# Patient Record
Sex: Female | Born: 1950 | Race: Black or African American | Hispanic: No | Marital: Married | Smoking: Never smoker
Health system: Southern US, Community
[De-identification: ages and names within clinical notes are randomized; demographics above are authoritative.]

## PROBLEM LIST (undated history)

## (undated) DIAGNOSIS — I82409 Acute embolism and thrombosis of unspecified deep veins of unspecified lower extremity: Secondary | ICD-10-CM

## (undated) DIAGNOSIS — N289 Disorder of kidney and ureter, unspecified: Secondary | ICD-10-CM

## (undated) DIAGNOSIS — E119 Type 2 diabetes mellitus without complications: Secondary | ICD-10-CM

## (undated) DIAGNOSIS — E7800 Pure hypercholesterolemia, unspecified: Secondary | ICD-10-CM

## (undated) DIAGNOSIS — I10 Essential (primary) hypertension: Secondary | ICD-10-CM

## (undated) HISTORY — PX: ABDOMINAL HYSTERECTOMY: SHX81

## (undated) HISTORY — PX: ROTATOR CUFF REPAIR: SHX139

## (undated) HISTORY — PX: KNEE SURGERY: SHX244

## (undated) HISTORY — PX: CHOLECYSTECTOMY: SHX55

## (undated) HISTORY — PX: ECTOPIC PREGNANCY SURGERY: SHX613

---

## 2013-09-11 ENCOUNTER — Ambulatory Visit: Payer: Self-pay | Admitting: Podiatry

## 2013-09-11 ENCOUNTER — Ambulatory Visit (INDEPENDENT_AMBULATORY_CARE_PROVIDER_SITE_OTHER): Payer: BC Managed Care – PPO | Admitting: Podiatry

## 2013-09-11 ENCOUNTER — Encounter: Payer: Self-pay | Admitting: Podiatry

## 2013-09-11 VITALS — BP 127/88 | HR 69

## 2013-09-11 DIAGNOSIS — M2010 Hallux valgus (acquired), unspecified foot: Secondary | ICD-10-CM

## 2013-09-11 DIAGNOSIS — B351 Tinea unguium: Secondary | ICD-10-CM | POA: Insufficient documentation

## 2013-09-11 DIAGNOSIS — M21619 Bunion of unspecified foot: Secondary | ICD-10-CM

## 2013-09-11 DIAGNOSIS — Q828 Other specified congenital malformations of skin: Secondary | ICD-10-CM | POA: Insufficient documentation

## 2013-09-11 MED ORDER — EFINACONAZOLE 10 % EX SOLN: 1.0000 "application " | Freq: Every morning | CUTANEOUS | Status: AC

## 2013-09-11 NOTE — Patient Instructions (Signed)
Seen for callus and fungal nails. All debrided. Jublia prescribed. Return as needed.

## 2013-09-11 NOTE — Progress Notes (Signed)
Subjective:  Moved from [place] 3 years ago. She has not seen a podiatrist since she left [place]  Patient presents with painful calluses and fungal nails. She was recently diagnosed with pre diabetic.   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  Thick dystrophic nail right hallux. All nails are thick and hypertrophic.  Neurovascular status are within normal. Orthopedic: Hallux valgus with bunion bilateral.  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 Plantar callus under 5th MPJ left and plantar medial heels.  Mycotic nails mild bilateral.  Plan: Reviewed findings. All callused lesions and nails debrided. Jublia prescribed.

## 2015-02-06 ENCOUNTER — Encounter: Payer: Self-pay | Admitting: Gastroenterology

## 2015-04-14 ENCOUNTER — Ambulatory Visit: Payer: Self-pay | Admitting: Gastroenterology

## 2016-06-10 ENCOUNTER — Encounter (HOSPITAL_BASED_OUTPATIENT_CLINIC_OR_DEPARTMENT_OTHER): Payer: Self-pay | Admitting: *Deleted

## 2016-06-10 ENCOUNTER — Emergency Department (HOSPITAL_BASED_OUTPATIENT_CLINIC_OR_DEPARTMENT_OTHER)
Admission: EM | Admit: 2016-06-10 | Discharge: 2016-06-10 | Disposition: A | Discharge status: Home / Self Care | Payer: Worker's Compensation | Attending: Emergency Medicine | Admitting: Emergency Medicine

## 2016-06-10 ENCOUNTER — Emergency Department (HOSPITAL_BASED_OUTPATIENT_CLINIC_OR_DEPARTMENT_OTHER): Payer: Worker's Compensation

## 2016-06-10 DIAGNOSIS — I10 Essential (primary) hypertension: Secondary | ICD-10-CM | POA: Diagnosis not present

## 2016-06-10 DIAGNOSIS — E119 Type 2 diabetes mellitus without complications: Secondary | ICD-10-CM | POA: Insufficient documentation

## 2016-06-10 DIAGNOSIS — M7989 Other specified soft tissue disorders: Secondary | ICD-10-CM | POA: Diagnosis not present

## 2016-06-10 DIAGNOSIS — M25571 Pain in right ankle and joints of right foot: Secondary | ICD-10-CM

## 2016-06-10 DIAGNOSIS — Z79899 Other long term (current) drug therapy: Secondary | ICD-10-CM | POA: Diagnosis not present

## 2016-06-10 HISTORY — DX: Acute embolism and thrombosis of unspecified deep veins of unspecified lower extremity: I82.409

## 2016-06-10 HISTORY — DX: Essential (primary) hypertension: I10

## 2016-06-10 HISTORY — DX: Type 2 diabetes mellitus without complications: E11.9
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
## 2016-06-10 NOTE — Discharge Instructions (Signed)
Your ultrasound today showed no evidence of a blood clot. Please follow up with orthopedics. Just in case, I have given you Dr. Lazaro ArmsHudnall's contact information. However, as we discussed, I think occupational health has referred you to someone already as part of your workman's comp. Return to the ER for new or worsening symptoms.
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
## 2017-04-06 ENCOUNTER — Encounter (HOSPITAL_BASED_OUTPATIENT_CLINIC_OR_DEPARTMENT_OTHER): Payer: Self-pay | Admitting: *Deleted

## 2017-04-06 ENCOUNTER — Emergency Department (HOSPITAL_BASED_OUTPATIENT_CLINIC_OR_DEPARTMENT_OTHER)
Admission: EM | Admit: 2017-04-06 | Discharge: 2017-04-06 | Disposition: A | Discharge status: Home / Self Care | Payer: BC Managed Care – PPO | Attending: Emergency Medicine | Admitting: Emergency Medicine

## 2017-04-06 DIAGNOSIS — R1032 Left lower quadrant pain: Secondary | ICD-10-CM | POA: Insufficient documentation

## 2017-04-06 DIAGNOSIS — I10 Essential (primary) hypertension: Secondary | ICD-10-CM | POA: Diagnosis not present

## 2017-04-06 DIAGNOSIS — M25552 Pain in left hip: Secondary | ICD-10-CM | POA: Diagnosis present

## 2017-04-06 DIAGNOSIS — E119 Type 2 diabetes mellitus without complications: Secondary | ICD-10-CM | POA: Diagnosis not present

## 2017-04-06 DIAGNOSIS — Z79899 Other long term (current) drug therapy: Secondary | ICD-10-CM | POA: Diagnosis not present

## 2017-04-06 MED ORDER — MELOXICAM 15 MG PO TABS: 15.0000 mg | ORAL_TABLET | Freq: Every day | ORAL | 0 refills | Status: DC | PRN

## 2017-04-06 MED ORDER — DEXAMETHASONE 4 MG PO TABS
8.0000 mg | ORAL_TABLET | Freq: Once | ORAL | Status: AC
  Administered 2017-04-06: 8 mg via ORAL
  Filled 2017-04-06: qty 2

## 2017-04-06 MED ORDER — KETOROLAC TROMETHAMINE 15 MG/ML IJ SOLN
30.0000 mg | Freq: Once | INTRAMUSCULAR | Status: AC
  Administered 2017-04-06: 30 mg via INTRAMUSCULAR
  Filled 2017-04-06: qty 2

## 2017-04-06 MED ORDER — TRAMADOL HCL 50 MG PO TABS: 50.0000 mg | ORAL_TABLET | Freq: Four times a day (QID) | ORAL | 0 refills | Status: DC | PRN

## 2017-04-06 MED ORDER — HYDROMORPHONE HCL 1 MG/ML IJ SOLN
1.0000 mg | Freq: Once | INTRAMUSCULAR | Status: AC
  Administered 2017-04-06: 1 mg via INTRAMUSCULAR
  Filled 2017-04-06: qty 1

## 2017-04-06 MED ORDER — LORAZEPAM 1 MG PO TABS
1.0000 mg | ORAL_TABLET | Freq: Once | ORAL | Status: AC
Start: 2017-04-06 — End: 2017-04-06
  Administered 2017-04-06: 1 mg via ORAL
  Filled 2017-04-06: qty 1

## 2017-04-06 MED ORDER — MELOXICAM 15 MG PO TABS: 15.0000 mg | ORAL_TABLET | Freq: Every day | ORAL | 0 refills | Status: AC | PRN

## 2017-04-06 NOTE — ED Triage Notes (Signed)
Pt c/o lower back pain which radiates down to left hip and buttocks
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
## 2017-04-06 NOTE — ED Notes (Signed)
Pt teaching provided on medications that may cause drowsiness. Pt instructed not to drive or operate heavy machinery while taking the prescribed medication. Pt verbalized understanding.   

## 2018-03-03 ENCOUNTER — Encounter (HOSPITAL_BASED_OUTPATIENT_CLINIC_OR_DEPARTMENT_OTHER): Payer: Self-pay | Admitting: Emergency Medicine

## 2018-03-03 ENCOUNTER — Emergency Department (HOSPITAL_BASED_OUTPATIENT_CLINIC_OR_DEPARTMENT_OTHER)
Admission: EM | Admit: 2018-03-03 | Discharge: 2018-03-03 | Disposition: A | Discharge status: Home / Self Care | Payer: BC Managed Care – PPO | Attending: Emergency Medicine | Admitting: Emergency Medicine

## 2018-03-03 ENCOUNTER — Other Ambulatory Visit: Payer: Self-pay

## 2018-03-03 ENCOUNTER — Emergency Department (HOSPITAL_BASED_OUTPATIENT_CLINIC_OR_DEPARTMENT_OTHER): Payer: BC Managed Care – PPO

## 2018-03-03 DIAGNOSIS — I10 Essential (primary) hypertension: Secondary | ICD-10-CM | POA: Diagnosis not present

## 2018-03-03 DIAGNOSIS — Z7984 Long term (current) use of oral hypoglycemic drugs: Secondary | ICD-10-CM | POA: Insufficient documentation

## 2018-03-03 DIAGNOSIS — R51 Headache: Secondary | ICD-10-CM | POA: Insufficient documentation

## 2018-03-03 DIAGNOSIS — E119 Type 2 diabetes mellitus without complications: Secondary | ICD-10-CM | POA: Insufficient documentation

## 2018-03-03 DIAGNOSIS — M542 Cervicalgia: Secondary | ICD-10-CM | POA: Diagnosis present

## 2018-03-03 DIAGNOSIS — Z79899 Other long term (current) drug therapy: Secondary | ICD-10-CM | POA: Diagnosis not present

## 2018-03-03 DIAGNOSIS — R202 Paresthesia of skin: Secondary | ICD-10-CM | POA: Insufficient documentation

## 2018-03-03 HISTORY — DX: Pure hypercholesterolemia, unspecified: E78.00

## 2018-03-03 HISTORY — DX: Disorder of kidney and ureter, unspecified: N28.9

## 2018-03-03 LAB — CBC WITH DIFFERENTIAL/PLATELET
Basophils Absolute: 0 10*3/uL (ref 0.0–0.1)
Basophils Relative: 0 %
EOS ABS: 0.1 10*3/uL (ref 0.0–0.7)
Eosinophils Relative: 1 %
HCT: 37.1 % (ref 36.0–46.0)
HEMOGLOBIN: 12.5 g/dL (ref 12.0–15.0)
LYMPHS ABS: 1.5 10*3/uL (ref 0.7–4.0)
Lymphocytes Relative: 24 %
MCH: 28.7 pg (ref 26.0–34.0)
MCHC: 33.7 g/dL (ref 30.0–36.0)
MCV: 85.1 fL (ref 78.0–100.0)
MONOS PCT: 12 %
Monocytes Absolute: 0.8 10*3/uL (ref 0.1–1.0)
NEUTROS PCT: 63 %
Neutro Abs: 4 10*3/uL (ref 1.7–7.7)
PLATELETS: 250 10*3/uL (ref 150–400)
RBC: 4.36 MIL/uL (ref 3.87–5.11)
RDW: 12.3 % (ref 11.5–15.5)
WBC: 6.3 10*3/uL (ref 4.0–10.5)

## 2018-03-03 LAB — BASIC METABOLIC PANEL
Anion gap: 7 (ref 5–15)
BUN: 14 mg/dL (ref 8–23)
CALCIUM: 9.3 mg/dL (ref 8.9–10.3)
CO2: 28 mmol/L (ref 22–32)
CREATININE: 1.34 mg/dL — AB (ref 0.44–1.00)
Chloride: 104 mmol/L (ref 98–111)
GFR calc non Af Amer: 40 mL/min — ABNORMAL LOW (ref >60)
GFR, EST AFRICAN AMERICAN: 46 mL/min — AB (ref >60)
Glucose, Bld: 113 mg/dL — ABNORMAL HIGH (ref 70–99)
Potassium: 3.8 mmol/L (ref 3.5–5.1)
SODIUM: 139 mmol/L (ref 135–145)

## 2018-03-03 MED ORDER — IOPAMIDOL (ISOVUE-370) INJECTION 76%
100.0000 mL | Freq: Once | INTRAVENOUS | Status: AC | PRN
  Administered 2018-03-03: 100 mL via INTRAVENOUS

## 2018-03-03 MED ORDER — MORPHINE SULFATE (PF) 4 MG/ML IV SOLN
4.0000 mg | Freq: Once | INTRAVENOUS | Status: AC
  Administered 2018-03-03: 4 mg via INTRAVENOUS
  Filled 2018-03-03: qty 1

## 2018-03-03 MED ORDER — GABAPENTIN 100 MG PO CAPS
100.0000 mg | ORAL_CAPSULE | Freq: Once | ORAL | Status: AC
  Administered 2018-03-03: 100 mg via ORAL
  Filled 2018-03-03: qty 1

## 2018-03-03 MED ORDER — PREDNISONE 20 MG PO TABS: 20.0000 mg | ORAL_TABLET | Freq: Every day | ORAL | 0 refills | Status: AC

## 2018-03-03 MED ORDER — FENTANYL CITRATE (PF) 100 MCG/2ML IJ SOLN
50.0000 ug | Freq: Once | INTRAMUSCULAR | Status: AC
  Administered 2018-03-03: 50 ug via INTRAVENOUS
  Filled 2018-03-03: qty 2

## 2018-03-03 MED ORDER — DIAZEPAM 5 MG/ML IJ SOLN
5.0000 mg | Freq: Once | INTRAMUSCULAR | Status: AC
  Administered 2018-03-03: 5 mg via INTRAVENOUS
  Filled 2018-03-03: qty 2

## 2018-03-03 MED ORDER — ORPHENADRINE CITRATE ER 100 MG PO TB12: 100.0000 mg | ORAL_TABLET | Freq: Two times a day (BID) | ORAL | 0 refills | Status: AC

## 2018-03-03 NOTE — Discharge Instructions (Signed)
You can take 1000 mg of Tylenol.  Do not exceed 4000 mg of Tylenol a day.  Take the prednisone as directed.  This may cause her blood sugars to rise over the next few days.  He can take the Norflex as directed.  This is a muscle relaxer.  It may work better than the one that you are previously prescribed.  Do not take this muscle relaxer and the other one at the same time.  Follow-up with your primary care doctor next 2 to 4 days for further evaluation.  Return to emergency department for any worsening pain, numbness/weakness of her arms or legs, fevers, vision changes or any other worsening or concerning symptoms.  As we discussed, there was a small nodule seen on your thyroid and today's examination.  This will need to be followed up by your primary care doctor.

## 2018-03-03 NOTE — ED Notes (Signed)
Provided water for pt for po challenge.

## 2018-03-03 NOTE — ED Triage Notes (Signed)
Neck pain radiating into her arms since Wednesday. Also reports numbness to both arms. Was called in a muscle relaxer from her PCP.

## 2018-03-03 NOTE — ED Provider Notes (Signed)
[facility] EMERGENCY DEPARTMENT Provider Note   CSN: [id number] Arrival date & time: 03/03/18  1154     History   Chief Complaint Chief Complaint  Patient presents with  . Neck pain    HPI Felicia Rasmussen is a 67 y.o. female with PMH/o DM, DVT, HTN who presents for evaluation of left sided neck pain x 3 days. She reports that 3 days ago she started having a "shooting" pain in the left side of her head. She states it felt like a headache and she took some OTC medications to help. Pain was gradual in nature with no thunderclap headache. She reports that shortly after she started having some left sided neck pain that she describes as "throbbing." She reports that that pain is worse with movement of her neck. She denies any preceding trauma, injury or fall. She called her PCP yesterday who prescribed her muscles relaxers. Patient states that they did not provide her much relief. Patient reports that she has slept in her chair the last two nights because of the pain. Because of this, she states she has laid on her arms. She reports that she would wake up with tingling sensation  In her BUE, most notably in her RUE. She reports that it would eventually resolve. She has been able to ambulate without any difficult. Patient denies any fevers, vision changes, chest pain, SOB, abdmoinal pain, nausea/vomiting, weight loss, saddle anesthesia, urinary or bowel incontinence.   The history is provided by the patient.    Past Medical History:  Diagnosis Date  . Diabetes mellitus without complication (HCC)   . DVT (deep venous thrombosis) (HCC)   . High cholesterol   . Hypertension   . Renal disorder     Patient Active Problem List   Diagnosis Date Noted  . Porokeratosis 09/11/2013  . Onychomycosis 09/11/2013  . Bunion 09/11/2013    Past Surgical History:  Procedure Laterality Date  . ABDOMINAL HYSTERECTOMY    . CHOLECYSTECTOMY    . ECTOPIC PREGNANCY SURGERY    . KNEE SURGERY        OB History   None      Home Medications    Prior to Admission medications   Medication Sig Start Date End Date Taking? Authorizing Provider  metoprolol tartrate (LOPRESSOR) 25 MG tablet Take 25 mg by mouth 2 (two) times daily.   Yes [provider]  sitaGLIPtin (JANUVIA) 100 MG tablet Take 100 mg by mouth daily.   Yes [provider]  b complex vitamins tablet Take 1 tablet by mouth daily.    [provider]  Biotin 1 MG CAPS Take by mouth daily.    [provider]  Efinaconazole (JUBLIA) 10 % SOLN Apply 1 application topically every morning. 09/11/13   Sheard, Myeong O, DPM  hydrochlorothiazide (HYDRODIURIL) 25 MG tablet Take 25 mg by mouth daily.    [provider]  lovastatin (MEVACOR) 10 MG tablet Take 10 mg by mouth at bedtime.    [provider]  meloxicam (MOBIC) 15 MG tablet Take 1 tablet (15 mg total) by mouth daily as needed for pain. 04/06/17   Raeford Razor, MD  Multiple Vitamin (MULTIVITAMIN) tablet Take 1 tablet by mouth daily.    [provider]  orphenadrine (NORFLEX) 100 MG tablet Take 1 tablet (100 mg total) by mouth 2 (two) times daily for 5 days. 03/03/18 03/08/18  Maxwell Caul, PA-C  predniSONE (DELTASONE) 20 MG tablet Take 1 tablet (20 mg total)  by mouth daily for 4 days. 03/03/18 03/07/18  Maxwell Caul, PA-C  traMADol (ULTRAM) 50 MG tablet Take 1 tablet (50 mg total) by mouth every 6 (six) hours as needed. 04/06/17   Raeford Razor, MD    Family History No family history on file.  Social History Social History   Tobacco Use  . Smoking status: Never Smoker  . Smokeless tobacco: Never Used  Substance Use Topics  . Alcohol use: No  . Drug use: No     Allergies   Nsaids and Vicodin [hydrocodone-acetaminophen]   Review of Systems Review of Systems  Constitutional: Negative for fever.  Respiratory: Negative for cough and shortness of breath.   Cardiovascular: Negative for chest pain.   Gastrointestinal: Negative for abdominal pain, nausea and vomiting.  Genitourinary: Negative for dysuria and hematuria.  Musculoskeletal: Positive for neck pain. Negative for back pain.  Neurological: Positive for numbness (tingling). Negative for weakness and headaches.  All other systems reviewed and are negative.    Physical Exam Updated Vital Signs BP (!) 147/94   Pulse (!) 59   Temp 98.5 F (36.9 C)   Resp 17   Ht 5\' 6"  (1.676 m)   Wt 90.3 kg (199 lb)   SpO2 94%   BMI 32.12 kg/m   Physical Exam  Constitutional: She is oriented to person, place, and time. She appears well-developed and well-nourished.  HENT:  Head: Normocephalic and atraumatic.  Mouth/Throat: Oropharynx is clear and moist and mucous membranes are normal.  Eyes: Pupils are equal, round, and reactive to light. Conjunctivae, EOM and lids are normal.  Neck: Neck supple. Carotid bruit is not present.    Some limited ROM secondary to pain. Neck is supple without rigidity. Tenderness to palpation noted to the left side of the neck that begins at the paraspinal muscles and extends anteriorly.   Cardiovascular: Normal rate, regular rhythm, normal heart sounds and normal pulses. Exam reveals no gallop and no friction rub.  No murmur heard. Pulmonary/Chest: Effort normal and breath sounds normal.  Abdominal: Soft. Normal appearance. There is no tenderness. There is no rigidity and no guarding.  Musculoskeletal: Normal range of motion.  Neurological: She is alert and oriented to person, place, and time.  Cranial nerves III-XII intact Follows commands, Moves all extremities  5/5 strength to BUE and BLE  Sensation intact throughout all major nerve distributions Normal finger to nose. No dysdiadochokinesia. No pronator drift. No gait abnormalities  No slurred speech. No facial droop.   Skin: Skin is warm and dry. Capillary refill takes less than 2 seconds.  Psychiatric: She has a normal mood and affect. Her  speech is normal.  Nursing note and vitals reviewed.    ED Treatments / Results  Labs (all labs ordered are listed, but only abnormal results are displayed) Labs Reviewed  BASIC METABOLIC PANEL - Abnormal; Notable for the following components:      Result Value   Glucose, Bld 113 (*)    Creatinine, Ser 1.34 (*)    GFR calc non Af Amer 40 (*)    GFR calc Af Amer 46 (*)    All other components within normal limits  CBC WITH DIFFERENTIAL/PLATELET    EKG None  Radiology Ct Angio Head W Or Wo Contrast  Result Date: 03/03/2018 CLINICAL DATA:  67 y/o  F; headache and neck pain for 3 days. EXAM: CT ANGIOGRAPHY HEAD AND NECK TECHNIQUE: Multidetector CT imaging of the head and neck was performed using the standard protocol  during bolus administration of intravenous contrast. Multiplanar CT image reconstructions and MIPs were obtained to evaluate the vascular anatomy. Carotid stenosis measurements (when applicable) are obtained utilizing NASCET criteria, using the distal internal carotid diameter as the denominator. CONTRAST:  ISOVUE-370 IOPAMIDOL (ISOVUE-370) INJECTION 76% COMPARISON:  None. FINDINGS: CT HEAD FINDINGS Brain: No evidence of acute infarction, hemorrhage, hydrocephalus, extra-axial collection or mass lesion/mass effect. Mild chronic microvascular ischemic changes and parenchymal volume loss of the brain Vascular: As below. Skull: Normal. Negative for fracture or focal lesion. Sinuses: Left maxillary sinus mucous retention cyst or polyp. Otherwise negative. Orbits: No acute finding. Review of the MIP images confirms the above findings CTA NECK FINDINGS Aortic arch: Bovine variant branching and left vertebral artery arises from the arch. Imaged portion shows no evidence of aneurysm or dissection. No significant stenosis of the major arch vessel origins. Right carotid system: No evidence of dissection, stenosis (50% or greater) or occlusion. Left carotid system: No evidence of  dissection, stenosis (50% or greater) or occlusion. Vertebral arteries: Codominant. No evidence of dissection, stenosis (50% or greater) or occlusion. Skeleton: Advanced cervical spondylosis with multilevel disc space narrowing and marginal osteophytes. No high-grade bony canal stenosis. Other neck: Multiple thyroid nodules measuring up to 17 mm in the right lobe of the thyroid (series 5, image 47). Upper chest: Several small pulmonary nodules in the lung apices and small focus of consolidation in the superior segment of the left lower lobe. Review of the MIP images confirms the above findings CTA HEAD FINDINGS Anterior circulation: No significant stenosis, proximal occlusion, aneurysm, or vascular malformation. Mild non stenotic calcific atherosclerosis of the carotid siphons. Posterior circulation: No significant stenosis, proximal occlusion, aneurysm, or vascular malformation. Minimal calcific atherosclerosis of vertebral arteries. Venous sinuses: As permitted by contrast timing, patent. Anatomic variants: Complete circle-of-Willis. Large left A1, small right A1, large anterior communicating artery, normal variant. Delayed phase: No abnormal intracranial enhancement. Review of the MIP images confirms the above findings IMPRESSION: 1. No large vessel occlusion, aneurysm, dissection, or hemodynamically significant stenosis by NASCET criteria. 2. Mild non stenotic calcific atherosclerosis of the aorta and carotid systems. 3. Thyroid nodules measuring up to 17 mm. Further evaluation with thyroid ultrasound is recommended on a nonemergent basis. 4. Cervical spondylosis with advanced multilevel discogenic degenerative changes. Electronically Signed   By: Mitzi Hansen M.D.   On: 03/03/2018 17:28   Ct Angio Neck W And/or Wo Contrast  Result Date: 03/03/2018 CLINICAL DATA:  67 y/o  F; headache and neck pain for 3 days. EXAM: CT ANGIOGRAPHY HEAD AND NECK TECHNIQUE: Multidetector CT imaging of the head and  neck was performed using the standard protocol during bolus administration of intravenous contrast. Multiplanar CT image reconstructions and MIPs were obtained to evaluate the vascular anatomy. Carotid stenosis measurements (when applicable) are obtained utilizing NASCET criteria, using the distal internal carotid diameter as the denominator. CONTRAST:  ISOVUE-370 IOPAMIDOL (ISOVUE-370) INJECTION 76% COMPARISON:  None. FINDINGS: CT HEAD FINDINGS Brain: No evidence of acute infarction, hemorrhage, hydrocephalus, extra-axial collection or mass lesion/mass effect. Mild chronic microvascular ischemic changes and parenchymal volume loss of the brain Vascular: As below. Skull: Normal. Negative for fracture or focal lesion. Sinuses: Left maxillary sinus mucous retention cyst or polyp. Otherwise negative. Orbits: No acute finding. Review of the MIP images confirms the above findings CTA NECK FINDINGS Aortic arch: Bovine variant branching and left vertebral artery arises from the arch. Imaged portion shows no evidence of aneurysm or dissection. No significant stenosis of the  major arch vessel origins. Right carotid system: No evidence of dissection, stenosis (50% or greater) or occlusion. Left carotid system: No evidence of dissection, stenosis (50% or greater) or occlusion. Vertebral arteries: Codominant. No evidence of dissection, stenosis (50% or greater) or occlusion. Skeleton: Advanced cervical spondylosis with multilevel disc space narrowing and marginal osteophytes. No high-grade bony canal stenosis. Other neck: Multiple thyroid nodules measuring up to 17 mm in the right lobe of the thyroid (series 5, image 47). Upper chest: Several small pulmonary nodules in the lung apices and small focus of consolidation in the superior segment of the left lower lobe. Review of the MIP images confirms the above findings CTA HEAD FINDINGS Anterior circulation: No significant stenosis, proximal occlusion, aneurysm, or vascular  malformation. Mild non stenotic calcific atherosclerosis of the carotid siphons. Posterior circulation: No significant stenosis, proximal occlusion, aneurysm, or vascular malformation. Minimal calcific atherosclerosis of vertebral arteries. Venous sinuses: As permitted by contrast timing, patent. Anatomic variants: Complete circle-of-Willis. Large left A1, small right A1, large anterior communicating artery, normal variant. Delayed phase: No abnormal intracranial enhancement. Review of the MIP images confirms the above findings IMPRESSION: 1. No large vessel occlusion, aneurysm, dissection, or hemodynamically significant stenosis by NASCET criteria. 2. Mild non stenotic calcific atherosclerosis of the aorta and carotid systems. 3. Thyroid nodules measuring up to 17 mm. Further evaluation with thyroid ultrasound is recommended on a nonemergent basis. 4. Cervical spondylosis with advanced multilevel discogenic degenerative changes. Electronically Signed   By: Mitzi Hansen M.D.   On: 03/03/2018 17:28    Procedures Procedures (including critical care time)  Medications Ordered in ED Medications  diazepam (VALIUM) injection 5 mg (5 mg Intravenous Given 03/03/18 1339)  iopamidol (ISOVUE-370) 76 % injection 100 mL (100 mLs Intravenous Contrast Given 03/03/18 1619)  fentaNYL (SUBLIMAZE) injection 50 mcg (50 mcg Intravenous Given 03/03/18 1727)  gabapentin (NEURONTIN) capsule 100 mg (100 mg Oral Given 03/03/18 1828)  morphine 4 MG/ML injection 4 mg (4 mg Intravenous Given 03/03/18 1900)     Initial Impression / Assessment and Plan / ED Course  I have reviewed the triage vital signs and the nursing notes.  Pertinent labs & imaging results that were available during my care of the patient were reviewed by me and considered in my medical decision making (see chart for details).     67 y.o. F with PMH/o HTN, DM who presents for evalution of neck pain x 3 days.  She denies any preceding trauma,  injury, fall.  No fevers.  She reports is because of the pain, she has had to sleep in her chair.  Patient reports that this is caused her to sleep on her arms on several occasions and states that when she has woken in the morning, she has had some tingling sensation to the bottom of her arms, right greater than left.  She reports after an hour so that the tingling sensation resolved.  She still able to move her arms some difficulty.  Denies any numbness/weakness at this time.  No dizziness, vision changes, facial drooping, difficulty speaking.  Patient is afebrile, non-toxic appearing, sitting comfortably on examination table. Vital signs reviewed and stable.  Patient is slightly hypertensive but will reassess after analgesics.  No neuro deficits on exam.  Patient with left-sided paraspinal tenderness noted on exam that extends just over to the midline extends midline.  Consider musculoskeletal strain versus torticollis.  While patient appears clinically stable and does not appear to be a dissection, given concerns of neck  pain and headache, limited range of motion, numbness/tingling complaints, also consider dissection.  Will plan to get basic labs, CTA of head and neck.  Analgesics provided.  History/physical exam is not concerning for meningitis.  BMP shows creatinine at 1.34.  Otherwise unremarkable.  CBC is without any significant leukocytosis, anemia.  CTA of head shows no evidence of large vessel occlusion, aneurysm, dissection.  There is an incidental finding of a thyroid nodule measuring up to 17 mm.  Skeletal structures and show underlying spondylosis with degenerative disc disease but otherwise no acute bony abnormalities.  Discussed results with patient.  She reports some very mild improvement in pain after analgesics.  Will give additional analgesics here in the department.  Updated patient on incidental finding of thyroid nodule and that she will need to have that evaluated.  Review of records  show the patient has had similar neck pain previously.  It looks like in 2018, she presented to her primary care doctor and wake with similar complaints.  At that time, was thought to be contributing to her degenerative disc disease.  Additionally, thyroid nodule was seen at that time.  Patient reports some improvement in pain after additional analgesics.  Vital signs are stable.  She is still slightly hypertensive but otherwise fine.  Patient is ambulating in the department.  We will send patient home with analgesics.  Encourage primary care follow-up. Patient had ample opportunity for questions and discussion. All patient's questions were answered with full understanding. Strict return precautions discussed. Patient expresses understanding and agreement to plan.   Final Clinical Impressions(s) / ED Diagnoses   Final diagnoses:  Neck pain    ED Discharge Orders        Ordered    predniSONE (DELTASONE) 20 MG tablet  Daily     03/03/18 1926    orphenadrine (NORFLEX) 100 MG tablet  2 times daily     03/03/18 1926       Maxwell CaulLayden, Ranisha Allaire A, PA-C 03/04/18 1503    Tegeler, Canary Brimhristopher J, MD 03/05/18 [phone]

## 2018-03-03 NOTE — ED Notes (Signed)
Pt in CT.

## 2018-04-18 ENCOUNTER — Emergency Department (HOSPITAL_BASED_OUTPATIENT_CLINIC_OR_DEPARTMENT_OTHER): Payer: No Typology Code available for payment source

## 2018-04-18 ENCOUNTER — Emergency Department (HOSPITAL_BASED_OUTPATIENT_CLINIC_OR_DEPARTMENT_OTHER)
Admission: EM | Admit: 2018-04-18 | Discharge: 2018-04-18 | Disposition: A | Discharge status: Home / Self Care | Payer: No Typology Code available for payment source | Attending: Emergency Medicine | Admitting: Emergency Medicine

## 2018-04-18 ENCOUNTER — Encounter (HOSPITAL_BASED_OUTPATIENT_CLINIC_OR_DEPARTMENT_OTHER): Payer: Self-pay

## 2018-04-18 ENCOUNTER — Other Ambulatory Visit: Payer: Self-pay

## 2018-04-18 DIAGNOSIS — E119 Type 2 diabetes mellitus without complications: Secondary | ICD-10-CM | POA: Diagnosis not present

## 2018-04-18 DIAGNOSIS — S40911A Unspecified superficial injury of right shoulder, initial encounter: Secondary | ICD-10-CM | POA: Diagnosis present

## 2018-04-18 DIAGNOSIS — M25512 Pain in left shoulder: Secondary | ICD-10-CM | POA: Insufficient documentation

## 2018-04-18 DIAGNOSIS — Y9389 Activity, other specified: Secondary | ICD-10-CM | POA: Diagnosis not present

## 2018-04-18 DIAGNOSIS — Y990 Civilian activity done for income or pay: Secondary | ICD-10-CM | POA: Diagnosis not present

## 2018-04-18 DIAGNOSIS — M25511 Pain in right shoulder: Secondary | ICD-10-CM | POA: Insufficient documentation

## 2018-04-18 DIAGNOSIS — W19XXXA Unspecified fall, initial encounter: Secondary | ICD-10-CM

## 2018-04-18 DIAGNOSIS — Z79899 Other long term (current) drug therapy: Secondary | ICD-10-CM | POA: Diagnosis not present

## 2018-04-18 DIAGNOSIS — S40912A Unspecified superficial injury of left shoulder, initial encounter: Secondary | ICD-10-CM | POA: Insufficient documentation

## 2018-04-18 DIAGNOSIS — Z7984 Long term (current) use of oral hypoglycemic drugs: Secondary | ICD-10-CM | POA: Diagnosis not present

## 2018-04-18 DIAGNOSIS — Y9259 Other trade areas as the place of occurrence of the external cause: Secondary | ICD-10-CM | POA: Insufficient documentation

## 2018-04-18 DIAGNOSIS — M25561 Pain in right knee: Secondary | ICD-10-CM

## 2018-04-18 DIAGNOSIS — I10 Essential (primary) hypertension: Secondary | ICD-10-CM | POA: Insufficient documentation

## 2018-04-18 DIAGNOSIS — W01198A Fall on same level from slipping, tripping and stumbling with subsequent striking against other object, initial encounter: Secondary | ICD-10-CM | POA: Diagnosis not present

## 2018-04-18 MED ORDER — TRAMADOL HCL 50 MG PO TABS: 50.0000 mg | ORAL_TABLET | Freq: Four times a day (QID) | ORAL | 0 refills | Status: AC | PRN

## 2018-04-18 MED ORDER — TRAMADOL HCL 50 MG PO TABS
50.0000 mg | ORAL_TABLET | Freq: Once | ORAL | Status: AC
  Administered 2018-04-18: 50 mg via ORAL
  Filled 2018-04-18: qty 1

## 2018-04-18 NOTE — ED Triage Notes (Signed)
Pt tripped over a carpet at work approx 3pm-pain to right knee, right shoulder, neck, left hand and left shoulder-to triage in w/c

## 2018-04-18 NOTE — ED Notes (Signed)
Pt/family verbalized understanding of discharge instructions.   

## 2018-04-18 NOTE — ED Provider Notes (Signed)
[facility] EMERGENCY DEPARTMENT Provider Note   CSN: [id number] Arrival date & time: 04/18/18  1557     History   Chief Complaint Chief Complaint  Patient presents with  . Fall    HPI Felicia Rasmussen is a 67 y.o. female.  She states she was leaving work and tripped over a carpet at around 3 PM today.  She fell onto her knees.  She denies LOC.  She is complaining of moderate pain to her right knee and bilateral shoulders.  She also had 1 of her fingernails on her left hand partially avulsed that she is to taper back down.  She denies hitting her head or back.  She is not on blood thinners.  No chest pain no shortness of breath no abdominal pain.  The history is provided by the patient.  Fall  This is a new problem. The current episode started 1 to 2 hours ago. The problem occurs constantly. The problem has not changed since onset.Pertinent negatives include no chest pain, no abdominal pain, no headaches and no shortness of breath. The symptoms are aggravated by walking, bending and twisting. Nothing relieves the symptoms. She has tried nothing for the symptoms. The treatment provided no relief.    Past Medical History:  Diagnosis Date  . Diabetes mellitus without complication (HCC)   . DVT (deep venous thrombosis) (HCC)   . High cholesterol   . Hypertension   . Renal disorder     Patient Active Problem List   Diagnosis Date Noted  . Porokeratosis 09/11/2013  . Onychomycosis 09/11/2013  . Bunion 09/11/2013    Past Surgical History:  Procedure Laterality Date  . ABDOMINAL HYSTERECTOMY    . CHOLECYSTECTOMY    . ECTOPIC PREGNANCY SURGERY    . KNEE SURGERY       OB History   None      Home Medications    Prior to Admission medications   Medication Sig Start Date End Date Taking? Authorizing Provider  b complex vitamins tablet Take 1 tablet by mouth daily.    [provider]  Biotin 1 MG CAPS Take by mouth daily.    [provider]    Efinaconazole (JUBLIA) 10 % SOLN Apply 1 application topically every morning. 09/11/13   Sheard, Myeong O, DPM  hydrochlorothiazide (HYDRODIURIL) 25 MG tablet Take 25 mg by mouth daily.    [provider]  lovastatin (MEVACOR) 10 MG tablet Take 10 mg by mouth at bedtime.    [provider]  meloxicam (MOBIC) 15 MG tablet Take 1 tablet (15 mg total) by mouth daily as needed for pain. 04/06/17   Raeford RazorKohut, Stephen, MD  metoprolol tartrate (LOPRESSOR) 25 MG tablet Take 25 mg by mouth 2 (two) times daily.    [provider]  Multiple Vitamin (MULTIVITAMIN) tablet Take 1 tablet by mouth daily.    [provider]  sitaGLIPtin (JANUVIA) 100 MG tablet Take 100 mg by mouth daily.    [provider]  traMADol (ULTRAM) 50 MG tablet Take 1 tablet (50 mg total) by mouth every 6 (six) hours as needed. 04/06/17   Raeford RazorKohut, Stephen, MD    Family History No family history on file.  Social History Social History   Tobacco Use  . Smoking status: Never Smoker  . Smokeless tobacco: Never Used  Substance Use Topics  . Alcohol use: No  . Drug use: No     Allergies   Nsaids and Vicodin [hydrocodone-acetaminophen]   Review of  Systems Review of Systems  Constitutional: Negative for fever.  HENT: Negative for sore throat.   Eyes: Negative for visual disturbance.  Respiratory: Negative for shortness of breath.   Cardiovascular: Negative for chest pain.  Gastrointestinal: Negative for abdominal pain.  Genitourinary: Negative for dysuria.  Musculoskeletal: Negative for back pain.  Skin: Negative for rash.  Neurological: Negative for headaches.     Physical Exam Updated Vital Signs BP (!) 169/77 (BP Location: Left Arm)   Pulse (!) 57   Temp 98.2 F (36.8 C) (Oral)   Resp 18   Ht 5\' 6"  (1.676 m)   Wt 85.7 kg   SpO2 99%   BMI 30.51 kg/m   Physical Exam  Constitutional: She appears well-developed and well-nourished.  HENT:  Head: Normocephalic and  atraumatic.  Eyes: Conjunctivae are normal.  Neck: Normal range of motion. Neck supple.  Cardiovascular: Normal rate, regular rhythm and normal heart sounds.  Pulmonary/Chest: Effort normal. She has no wheezes. She has no rales. She exhibits no tenderness.  Abdominal: Soft. She exhibits no mass. There is no tenderness. There is no guarding.  Musculoskeletal: Normal range of motion.  She is diffuse tenderness over her right and left anterior shoulders.  She is got normal internal and external rotation and normal landmarks.  She also has some tenderness over her left hand third digit at the PIP.  There is full range of motion though no overlying ecchymosis.  She also has tenderness over her right patella although no joint line tenderness and no effusion or ecchymosis.  Neurological: She is alert. GCS eye subscore is 4. GCS verbal subscore is 5. GCS motor subscore is 6.  Skin: Skin is warm and dry. Capillary refill takes less than 2 seconds.  Psychiatric: She has a normal mood and affect.  Nursing note and vitals reviewed.    ED Treatments / Results  Labs (all labs ordered are listed, but only abnormal results are displayed) Labs Reviewed - No data to display  EKG None  Radiology Dg Shoulder Right  Result Date: 04/18/2018 CLINICAL DATA:  Fall with shoulder pain EXAM: RIGHT SHOULDER - 2+ VIEW COMPARISON:  None. FINDINGS: Moderate AC joint degenerative change. Mild glenohumeral degenerative change. No fracture or malalignment. IMPRESSION: No acute osseous abnormality Electronically Signed   By: Jasmine PangKim  Fujinaga M.D.   On: 04/18/2018 17:28   Dg Shoulder Left  Result Date: 04/18/2018 CLINICAL DATA:  Fall with shoulder pain EXAM: LEFT SHOULDER - 2+ VIEW COMPARISON:  None. FINDINGS: Moderate AC joint degenerative change. Mild glenohumeral degenerative change. No acute fracture or dislocation. IMPRESSION: No acute osseous abnormality Electronically Signed   By: Jasmine PangKim  Fujinaga M.D.   On: 04/18/2018  17:27   Dg Knee Complete 4 Views Right  Result Date: 04/18/2018 CLINICAL DATA:  Fall with knee pain EXAM: RIGHT KNEE - COMPLETE 4+ VIEW COMPARISON:  None. FINDINGS: Prominent joint space calcification. Mild joint space narrowing medially laterally. Mild patellofemoral degenerative change. Small knee effusion. No fracture or malalignment. IMPRESSION: No acute osseous abnormality. Mild tricompartment arthritis. Chondrocalcinosis. Electronically Signed   By: Jasmine PangKim  Fujinaga M.D.   On: 04/18/2018 17:29   Dg Finger Middle Left  Result Date: 04/18/2018 CLINICAL DATA:  Fall with finger pain EXAM: LEFT MIDDLE FINGER 2+V COMPARISON:  None. FINDINGS: There is no evidence of fracture or dislocation. There is no evidence of arthropathy or other focal bone abnormality. Soft tissues are unremarkable. IMPRESSION: Negative. Electronically Signed   By: Jasmine PangKim  Fujinaga M.D.   On: 04/18/2018  17:30    Procedures Procedures (including critical care time)  Medications Ordered in ED Medications  traMADol (ULTRAM) tablet 50 mg (has no administration in time range)     Initial Impression / Assessment and Plan / ED Course  I have reviewed the triage vital signs and the nursing notes.  Pertinent labs & imaging results that were available during my care of the patient were reviewed by me and considered in my medical decision making (see chart for details).  Clinical Course as of Apr 20 1329  Wed Apr 18, 2018  1816 Patient's imaging is all negative.  She had some improvement with the tramadol.  She is asking that we give her a prescription for that and I will also give her a note for work for tomorrow.   [MB]    Clinical Course User Index [MB] Terrilee Files, MD     Final Clinical Impressions(s) / ED Diagnoses   Final diagnoses:  Acute pain of both shoulders  Acute pain of right knee  Fall, initial encounter    ED Discharge Orders         Ordered    traMADol (ULTRAM) 50 MG tablet  Every 6 hours PRN       04/18/18 1817           Terrilee Files, MD 04/20/18 1331

## 2018-04-18 NOTE — Discharge Instructions (Addendum)
You were evaluated in the emergency department for pain in your shoulders and your knees after a fall.  You had x-rays that did not show an obvious fracture.  We are prescribing some medicine to help you with the pain.  You should use ice to the affected areas.  Please follow-up with your doctor and return if any concerns.

## 2018-04-24 ENCOUNTER — Emergency Department (HOSPITAL_BASED_OUTPATIENT_CLINIC_OR_DEPARTMENT_OTHER)
Admission: EM | Admit: 2018-04-24 | Discharge: 2018-04-24 | Disposition: A | Discharge status: Home / Self Care | Payer: No Typology Code available for payment source | Attending: Emergency Medicine | Admitting: Emergency Medicine

## 2018-04-24 ENCOUNTER — Other Ambulatory Visit: Payer: Self-pay

## 2018-04-24 ENCOUNTER — Encounter (HOSPITAL_BASED_OUTPATIENT_CLINIC_OR_DEPARTMENT_OTHER): Payer: Self-pay

## 2018-04-24 DIAGNOSIS — M545 Low back pain, unspecified: Secondary | ICD-10-CM

## 2018-04-24 DIAGNOSIS — M542 Cervicalgia: Secondary | ICD-10-CM | POA: Diagnosis not present

## 2018-04-24 DIAGNOSIS — Z7984 Long term (current) use of oral hypoglycemic drugs: Secondary | ICD-10-CM | POA: Insufficient documentation

## 2018-04-24 DIAGNOSIS — M25512 Pain in left shoulder: Secondary | ICD-10-CM

## 2018-04-24 DIAGNOSIS — E119 Type 2 diabetes mellitus without complications: Secondary | ICD-10-CM | POA: Insufficient documentation

## 2018-04-24 DIAGNOSIS — Z79899 Other long term (current) drug therapy: Secondary | ICD-10-CM | POA: Diagnosis not present

## 2018-04-24 DIAGNOSIS — Z86718 Personal history of other venous thrombosis and embolism: Secondary | ICD-10-CM | POA: Insufficient documentation

## 2018-04-24 DIAGNOSIS — I10 Essential (primary) hypertension: Secondary | ICD-10-CM | POA: Diagnosis not present

## 2018-04-24 MED ORDER — LIDOCAINE 5 % EX PTCH: 1.0000 | MEDICATED_PATCH | CUTANEOUS | 0 refills | Status: AC

## 2018-04-24 MED ORDER — DEXAMETHASONE SODIUM PHOSPHATE 10 MG/ML IJ SOLN
10.0000 mg | Freq: Once | INTRAMUSCULAR | Status: AC
  Administered 2018-04-24: 10 mg via INTRAMUSCULAR
  Filled 2018-04-24: qty 1

## 2018-04-24 NOTE — ED Provider Notes (Signed)
[facility] EMERGENCY DEPARTMENT Provider Note   CSN: [id number] Arrival date & time: 04/24/18  1654     History   Chief Complaint Chief Complaint  Patient presents with  . Fall    HPI Felicia Rasmussen is a 67 y.o. female with history of diabetes mellitus, DVT, hypertension, hyperlipidemia presents for evaluation of acute onset, intermittent left shoulder and left-sided back pain which began yesterday.  She was seen and evaluated on 04/18/2018 status post mechanical fall in which she landed on both knees with an outstretched left hand.  Radiographs were obtained at the time which were negative.  She states that her pain had improved significantly since then but yesterday she began noticing sharp burning pains along the left shoulder radiating to the left side of the neck.  Pain worsens with upward movement of the left shoulder, improves with keeping the arm still.  Denies numbness or weakness.  She also notes intermittent back "spasms "in which she experienced sharp pains to the left side of the back radiating downward to the buttocks.  This pain is elicited with bending and rotation as well as position changes.  Denies numbness or weakness, no saddle anesthesia, bowel or bladder incontinence, fevers, IV drug use, or history of cancer.  She has tried tramadol without relief of her symptoms.  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  The history is provided by the patient.    Past Medical History:  Diagnosis Date  . Diabetes mellitus without complication (HCC)   . DVT (deep venous thrombosis) (HCC)   . High cholesterol   . Hypertension   . Renal disorder     Patient Active Problem List   Diagnosis Date Noted  . Porokeratosis 09/11/2013  . Onychomycosis 09/11/2013  . Bunion  09/11/2013    Past Surgical History:  Procedure Laterality Date  . ABDOMINAL HYSTERECTOMY    . CHOLECYSTECTOMY    . ECTOPIC PREGNANCY SURGERY    . KNEE SURGERY       OB History   None      Home Medications    Prior to Admission medications   Medication Sig Start Date End Date Taking? Authorizing Provider  b complex vitamins tablet Take 1 tablet by mouth daily.    [provider]  Biotin 1 MG CAPS Take by mouth daily.    [provider]  Efinaconazole (JUBLIA) 10 % SOLN Apply 1 application topically every morning. 09/11/13   Sheard, Myeong O, DPM  hydrochlorothiazide (HYDRODIURIL) 25 MG tablet Take 25 mg by mouth daily.    [provider]  lidocaine (LIDODERM) 5 % Place 1 patch onto the skin daily. Remove & Discard patch within 12 hours or as directed by MD 04/24/18   Michela Pitcher A, PA-C  lovastatin (MEVACOR) 10 MG tablet Take 10 mg by mouth at bedtime.    [provider]  meloxicam (MOBIC) 15 MG tablet Take 1 tablet (15 mg total) by mouth daily as needed for pain. 04/06/17   Raeford Razor, MD  metoprolol tartrate (LOPRESSOR) 25 MG tablet Take 25 mg by mouth 2 (two) times daily.    [provider]  Multiple Vitamin (MULTIVITAMIN) tablet Take 1 tablet by mouth daily.    [provider]  sitaGLIPtin (JANUVIA) 100 MG  tablet Take 100 mg by mouth daily.    [provider]  traMADol (ULTRAM) 50 MG tablet Take 1 tablet (50 mg total) by mouth every 6 (six) hours as needed for severe pain. 04/18/18   Terrilee FilesButler, Michael C, MD    Family History No family history on file.  Social History Social History   Tobacco Use  . Smoking status: Never Smoker  . Smokeless tobacco: Never Used  Substance Use Topics  . Alcohol use: No  . Drug use: No     Allergies   Nsaids and Vicodin [hydrocodone-acetaminophen]   Review of Systems Review of Systems  Constitutional: Negative for chills and fever.  Genitourinary: Negative for  difficulty urinating.  Musculoskeletal: Positive for arthralgias and back pain.  Neurological: Negative for syncope, weakness and numbness.     Physical Exam Updated Vital Signs BP (!) 152/85   Pulse 61   Temp 98.3 F (36.8 C) (Oral)   Resp 16   Ht 5\' 6"  (1.676 m)   Wt 85.7 kg   SpO2 100%   BMI 30.51 kg/m   Physical Exam  Constitutional: She appears well-developed and well-nourished. No distress.  HENT:  Head: Normocephalic and atraumatic.  Eyes: Conjunctivae are normal. Right eye exhibits no discharge. Left eye exhibits no discharge.  Neck: Normal range of motion. Neck supple. No JVD present. No tracheal deviation present.  No midline cervical spine tenderness, mild left paracervical muscle tenderness and spasm noted in the trapezius distribution.  No deformity, crepitus, or step-off noted.  Full active range of motion of the cervical spine with pain elicited in the low back with lateral rotation to the right.  Cardiovascular: Normal rate and intact distal pulses.  Pulmonary/Chest: Effort normal.  Abdominal: Soft. Bowel sounds are normal. She exhibits no distension. There is no tenderness. There is no guarding.  Musculoskeletal: She exhibits tenderness. She exhibits no edema.  No midline spine tenderness, mild right paralumbar muscle tenderness noted in the upper lumbar region with some spasm.  No deformity, crepitus, or step-off noted.  5/5 strength of BUE and BLE major muscle groups.  She has normal active range of motion of the left shoulder with pain elicited with flexion.  Negative empty can sign, positive Hawkins impingement test.  Mild tenderness to palpation overlying the left acromioclavicular joint and left bicipital tendon groove.  Decreased active range of motion of the lumbar spine with flexion and extension secondary to pain in anticipation of pain.  Pain elicited with lateral rotation to the right.    Neurological: She is alert. No sensory deficit. She exhibits normal  muscle tone.  Fluent speech with no evidence of dysarthria or aphasia, no facial droop, sensation intact to soft touch of bilateral upper and lower extremities.  Good grip strength bilaterally.  Ambulates with a mildly antalgic gait but is able to Heel Walk and Toe Walk without difficulty.  Skin: Skin is warm and dry. No erythema.  Psychiatric: She has a normal mood and affect. Her behavior is normal.  Nursing note and vitals reviewed.    ED Treatments / Results  Labs (all labs ordered are listed, but only abnormal results are displayed) Labs Reviewed - No data to display  EKG None  Radiology No results found.  Procedures Procedures (including critical care time)  Medications Ordered in ED Medications  dexamethasone (DECADRON) injection 10 mg (10 mg Intramuscular Given 04/24/18 1919)     Initial Impression / Assessment and Plan / ED Course  I have reviewed the  triage vital signs and the nursing notes.  Pertinent labs & imaging results that were available during my care of the patient were reviewed by me and considered in my medical decision making (see chart for details).     Patient with intermittent left shoulder and left-sided low back pain since yesterday.  She is afebrile, vital signs are stable.  Recent injury 1 week ago.  Improvement since then until yesterday.  No red flag signs concerning for cauda equina or spinal abscess.  No concern for dissection.  Examination suggestive of musculoskeletal etiology specifically muscle spasm as pain is elicited with position changes and certain movements.  Examination of the left shoulder suggestive of possible rotator cuff injury although strength and sensation are intact.  Radiographs from 1 week ago show no acute osseous abnormality and she has no midline spine tenderness so I do not see a need for emergent imaging at this time. RICE therapy indicated and discussed with patient.  She cannot take NSAIDs secondary to kidney disease, but  was given a steroid taper in July for neck pain.  We will give IM Decadron in the ED which is long-acting.  Recommend Tylenol, heat, gentle stretching, and lidocaine patches.  She has muscle relaxer at home which I recommend she take at night only, advised of appropriate use of this medicine.  Discussed strict ED return precautions.  Recommend follow-up with PCP or orthopedist for reevaluation of symptoms.  Patient and patient's husband verbalized understanding of and agreement with plan and patient is stable for discharge home at this time.  Final Clinical Impressions(s) / ED Diagnoses   Final diagnoses:  Acute pain of left shoulder  Acute left-sided low back pain without sciatica    ED Discharge Orders         Ordered    lidocaine (LIDODERM) 5 %  Every 24 hours     04/24/18 1914           Jeanie SewerFawze, Osualdo Hansell A, PA-C 04/24/18 2005    [address] Lawrence StreetFloyd, Jesusita Okaan, DO 04/24/18 2129

## 2018-04-24 NOTE — ED Triage Notes (Signed)
Pt states she fell last Wednesday and was seen-c/o pain to lower back and left shoulder-NAD-slow gait

## 2018-04-24 NOTE — Discharge Instructions (Addendum)
1. Medications: Take [phone] mg of Tylenol every 6 hours as needed for pain. Do not exceed 4000 mg of Tylenol daily.   You can take your muscle relaxer Norflex as needed for muscle spasm up to twice daily but do not drive, drink alcohol, or operate heavy machinery while taking this medicine because it may make you drowsy.  I typically recommend taking this medicine only at night when you are going to sleep.  You can also cut these tablets in half if they make you feel very drowsy. You can also apply a lidocaine patch to the affected areas as prescribed.  2. Treatment: rest, drink plenty of fluids, gentle stretching as discussed (see attached), alternate ice and heat (or stick with whichever feels best) 20 minutes on 20 minutes off. 3. Follow Up: Please followup with your primary doctor or orthopedist (I have attached information for Dr. Pearletha ForgeHudnall) in 3-7 days for discussion of your diagnoses and further evaluation after today's visit;  Return to the ER for worsening back pain, weakness, loss of pulses, difficulty walking, loss of bowel or bladder control or other concerning symptoms

## 2018-09-03 ENCOUNTER — Other Ambulatory Visit: Payer: Self-pay | Admitting: Orthopedic Surgery

## 2018-09-03 DIAGNOSIS — M25512 Pain in left shoulder: Secondary | ICD-10-CM

## 2018-09-13 ENCOUNTER — Ambulatory Visit
Admission: RE | Admit: 2018-09-13 | Discharge: 2018-09-13 | Disposition: A | Discharge status: Home / Self Care | Payer: Worker's Compensation | Source: Ambulatory Visit | Attending: Orthopedic Surgery | Admitting: Orthopedic Surgery

## 2018-09-13 DIAGNOSIS — M25512 Pain in left shoulder: Secondary | ICD-10-CM

## 2018-09-13 MED ORDER — IOPAMIDOL (ISOVUE-M 200) INJECTION 41%
1.0000 mL | Freq: Once | INTRAMUSCULAR | Status: AC
  Administered 2018-09-13: 1 mL via INTRA_ARTICULAR

## 2018-09-13 MED ORDER — METHYLPREDNISOLONE ACETATE 40 MG/ML INJ SUSP (RADIOLOG
120.0000 mg | Freq: Once | INTRAMUSCULAR | Status: AC
  Administered 2018-09-13: 120 mg via INTRA_ARTICULAR

## 2019-06-24 IMAGING — XA DG FLUORO GUIDE NDL PLC/BX
1 series · 1 of 1 positions shown · non-contrast
Comparison: none

CLINICAL DATA: LEFT shoulder pain, unspecified etiology and
chronicity.

[Series 1: ortho standard · 1 of 1 slices shown]
[im 1/1]
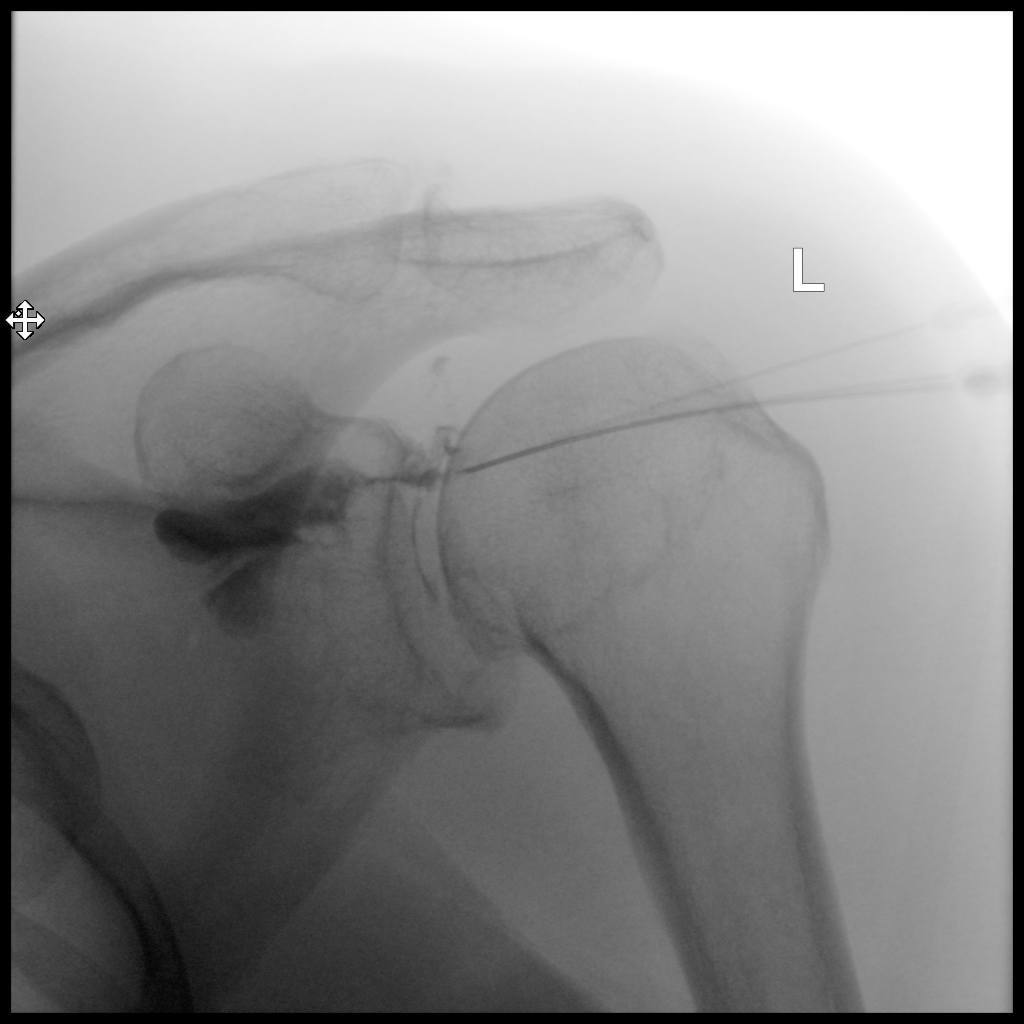

[1 of 1 positions shown; findings below may reference images not displayed]

FLUOROSCOPY TIME:  8 seconds corresponding to a Dose Area Product of
10.64 Gy*m2

PROCEDURE:
LEFT SHOULDER INJECTION UNDER FLUOROSCOPY

Informed written consent was obtained.  Time-out was performed.

An appropriate skin entrance site was determined. The site was
marked, prepped with Betadine, draped in the usual sterile fashion,
and infiltrated locally with 1% lidocaine. 22 gauge spinal needle
was advanced to the superomedial margin of the humeral head under
intermittent fluoroscopy. 1 ml of Lidocaine injected easily. 120 mg
Depo-Medrol mixed with 3 mL 1% lidocaine and 3 mL 0.5% Sensorcaine
were injected. Postprocedure, the patient was comfortable.
IMPRESSION: Technically successful LEFT shoulder therapeutic intra-articular
steroid and anesthetic injection.

## 2020-12-30 ENCOUNTER — Emergency Department (HOSPITAL_BASED_OUTPATIENT_CLINIC_OR_DEPARTMENT_OTHER): Payer: Medicare Other

## 2020-12-30 ENCOUNTER — Emergency Department (HOSPITAL_BASED_OUTPATIENT_CLINIC_OR_DEPARTMENT_OTHER)
Admission: EM | Admit: 2020-12-30 | Discharge: 2020-12-30 | Disposition: A | Discharge status: Home / Self Care | Payer: Medicare Other | Attending: Emergency Medicine | Admitting: Emergency Medicine

## 2020-12-30 ENCOUNTER — Encounter (HOSPITAL_BASED_OUTPATIENT_CLINIC_OR_DEPARTMENT_OTHER): Payer: Self-pay

## 2020-12-30 ENCOUNTER — Other Ambulatory Visit: Payer: Self-pay

## 2020-12-30 DIAGNOSIS — Z7984 Long term (current) use of oral hypoglycemic drugs: Secondary | ICD-10-CM | POA: Insufficient documentation

## 2020-12-30 DIAGNOSIS — R030 Elevated blood-pressure reading, without diagnosis of hypertension: Secondary | ICD-10-CM | POA: Diagnosis present

## 2020-12-30 DIAGNOSIS — I10 Essential (primary) hypertension: Secondary | ICD-10-CM | POA: Insufficient documentation

## 2020-12-30 DIAGNOSIS — Z79899 Other long term (current) drug therapy: Secondary | ICD-10-CM | POA: Insufficient documentation

## 2020-12-30 DIAGNOSIS — E119 Type 2 diabetes mellitus without complications: Secondary | ICD-10-CM | POA: Insufficient documentation

## 2020-12-30 LAB — BASIC METABOLIC PANEL
Anion gap: 9 (ref 5–15)
BUN: 23 mg/dL (ref 8–23)
CO2: 24 mmol/L (ref 22–32)
Calcium: 9.6 mg/dL (ref 8.9–10.3)
Chloride: 104 mmol/L (ref 98–111)
Creatinine, Ser: 1.27 mg/dL — ABNORMAL HIGH (ref 0.44–1.00)
GFR, Estimated: 45 mL/min — ABNORMAL LOW (ref >60)
Glucose, Bld: 91 mg/dL (ref 70–99)
Potassium: 4.1 mmol/L (ref 3.5–5.1)
Sodium: 137 mmol/L (ref 135–145)

## 2020-12-30 LAB — CBC
HCT: 37.8 % (ref 36.0–46.0)
Hemoglobin: 12.3 g/dL (ref 12.0–15.0)
MCH: 28.4 pg (ref 26.0–34.0)
MCHC: 32.5 g/dL (ref 30.0–36.0)
MCV: 87.3 fL (ref 80.0–100.0)
Platelets: 293 10*3/uL (ref 150–400)
RBC: 4.33 MIL/uL (ref 3.87–5.11)
RDW: 13 % (ref 11.5–15.5)
WBC: 4.4 10*3/uL (ref 4.0–10.5)
nRBC: 0 % (ref 0.0–0.2)

## 2020-12-30 LAB — TROPONIN I (HIGH SENSITIVITY): Troponin I (High Sensitivity): 4 ng/L (ref <18)

## 2020-12-30 NOTE — ED Provider Notes (Signed)
[facility] EMERGENCY DEPARTMENT Provider Note   CSN: [id number] Arrival date & time: 12/30/20  1619     History Chief Complaint  Patient presents with  . Hypertension    Felicia Rasmussen is a 70 y.o. female.  She willPatient with a history of hypertension.  Patient was evaluated by her nephrologist yesterday.  And then went to urgent care today about the blood pressure.  And then came in here because she is concerned about it being way too high and that it needs to be brought down.  Her nephrologist recommended that she continue to trend her blood pressure at home and then give them a call back.  Today at urgent care they recommended also trending the blood pressure.  The patient was concerned did come in here feeling that something needs to be done acutely to the blood pressure.  Patient currently without headache chest pain shortness of breath or any strokelike symptoms.  Patient's had longstanding hypertension.  There is been no recent change in any of her medications and she has been taking her blood pressure medication.  Patient has slight headache earlier today but it resolved spontaneously.  It was not severe.        Past Medical History:  Diagnosis Date  . Diabetes mellitus without complication (HCC)   . DVT (deep venous thrombosis) (HCC)   . High cholesterol   . Hypertension   . Renal disorder     Patient Active Problem List   Diagnosis Date Noted  . Porokeratosis 09/11/2013  . Onychomycosis 09/11/2013  . Bunion 09/11/2013    Past Surgical History:  Procedure Laterality Date  . ABDOMINAL HYSTERECTOMY    . CHOLECYSTECTOMY    . ECTOPIC PREGNANCY SURGERY    . KNEE SURGERY    . ROTATOR CUFF REPAIR       OB History   No obstetric history on file.     No family history on file.  Social History   Tobacco Use  . Smoking status: Never Smoker  . Smokeless tobacco: Never Used  Substance Use Topics  . Alcohol use: No  . Drug use: No    Home  Medications Prior to Admission medications   Medication Sig Start Date End Date Taking? Authorizing Provider  b complex vitamins tablet Take 1 tablet by mouth daily.   Yes [provider]  carvedilol (COREG) 6.25 MG tablet Take 6.25 mg by mouth 2 (two) times daily with a meal.   Yes [provider]  lovastatin (MEVACOR) 10 MG tablet Take 10 mg by mouth at bedtime.   Yes [provider]  Biotin 1 MG CAPS Take by mouth daily.    [provider]  Efinaconazole (JUBLIA) 10 % SOLN Apply 1 application topically every morning. 09/11/13   Sheard, Myeong O, DPM  hydrochlorothiazide (HYDRODIURIL) 25 MG tablet Take 25 mg by mouth daily.    [provider]  lidocaine (LIDODERM) 5 % Place 1 patch onto the skin daily. Remove & Discard patch within 12 hours or as directed by MD 04/24/18   Michela Pitcher A, PA-C  meloxicam (MOBIC) 15 MG tablet Take 1 tablet (15 mg total) by mouth daily as needed for pain. 04/06/17   Raeford Razor, MD  metoprolol tartrate (LOPRESSOR) 25 MG tablet Take 25 mg by mouth 2 (two) times daily.    [provider]  Multiple Vitamin (MULTIVITAMIN) tablet Take 1 tablet by mouth daily.    [provider]  sitaGLIPtin (JANUVIA) 100 MG  tablet Take 100 mg by mouth daily.    [provider]  traMADol (ULTRAM) 50 MG tablet Take 1 tablet (50 mg total) by mouth every 6 (six) hours as needed for severe pain. 04/18/18   Terrilee Files, MD    Allergies    Nsaids and Vicodin [hydrocodone-acetaminophen]  Review of Systems   Review of Systems  Constitutional: Negative for chills and fever.  HENT: Negative for congestion, rhinorrhea and sore throat.   Eyes: Negative for visual disturbance.  Respiratory: Negative for cough and shortness of breath.   Cardiovascular: Negative for chest pain and leg swelling.  Gastrointestinal: Negative for abdominal pain, diarrhea, nausea and vomiting.  Genitourinary: Negative for dysuria.   Musculoskeletal: Negative for back pain and neck pain.  Skin: Negative for rash.  Neurological: Negative for dizziness, facial asymmetry, weakness, light-headedness, numbness and headaches.  Hematological: Does not bruise/bleed easily.  Psychiatric/Behavioral: Negative for confusion.    Physical Exam Updated Vital Signs BP (!) 183/83   Pulse (!) 50   Temp 97.8 F (36.6 C) (Oral)   Resp 17   Ht 1.676 m (5\' 6" )   Wt 92.6 kg   SpO2 100%   BMI 32.94 kg/m   Physical Exam Vitals and nursing note reviewed.  Constitutional:      General: She is not in acute distress.    Appearance: Normal appearance. She is well-developed.  HENT:     Head: Normocephalic and atraumatic.  Eyes:     Extraocular Movements: Extraocular movements intact.     Conjunctiva/sclera: Conjunctivae normal.     Pupils: Pupils are equal, round, and reactive to light.  Cardiovascular:     Rate and Rhythm: Normal rate and regular rhythm.     Heart sounds: No murmur heard.   Pulmonary:     Effort: Pulmonary effort is normal. No respiratory distress.     Breath sounds: Normal breath sounds. No rales.  Abdominal:     Palpations: Abdomen is soft.     Tenderness: There is no abdominal tenderness.  Musculoskeletal:        General: No swelling. Normal range of motion.     Cervical back: Neck supple.  Skin:    General: Skin is warm and dry.  Neurological:     General: No focal deficit present.     Mental Status: She is alert and oriented to person, place, and time.     Cranial Nerves: No cranial nerve deficit.     Sensory: No sensory deficit.     Motor: No weakness.     ED Results / Procedures / Treatments   Labs (all labs ordered are listed, but only abnormal results are displayed) Labs Reviewed  BASIC METABOLIC PANEL - Abnormal; Notable for the following components:      Result Value   Creatinine, Ser 1.27 (*)    GFR, Estimated 45 (*)    All other components within normal limits  CBC  TROPONIN I  (HIGH SENSITIVITY)  TROPONIN I (HIGH SENSITIVITY)    EKG EKG Interpretation  Date/Time:  Wednesday December 30 2020 16:33:53 EDT Ventricular Rate:  52 PR Interval:  164 QRS Duration: 138 QT Interval:  446 QTC Calculation: 414 R Axis:   25 Text Interpretation: Sinus bradycardia Right bundle branch block Abnormal ECG No previous ECGs available Confirmed by 01-06-1986 [phone]) on 12/30/2020 6:30:29 PM   Radiology DG Chest 2 View  Result Date: 12/30/2020 CLINICAL DATA:  Hypertension with neck pain. EXAM: CHEST - 2 VIEW COMPARISON:  None available. FINDINGS: Normal heart size.The cardiomediastinal contours are normal. Mild aortic atherosclerosis. Pulmonary vasculature is normal. No consolidation, pleural effusion, or pneumothorax. No acute osseous abnormalities are seen. Thoracic spondylosis most prominently affecting the lower thoracic spine. IMPRESSION: No acute chest findings. Electronically Signed   By: Narda Rutherford M.D.   On: 12/30/2020 17:02    Procedures Procedures   Medications Ordered in ED Medications - No data to display  ED Course  I have reviewed the triage vital signs and the nursing notes.  Pertinent labs & imaging results that were available during my care of the patient were reviewed by me and considered in my medical decision making (see chart for details).    MDM Rules/Calculators/A&P                           Certainly by history the past 2 days blood pressure has been elevated.  Recommend that she trend her blood pressure with a blood pressure log.  Printed her labs here today for her nephrologist.  Recently her primary care doctor left the practice where she was at so she is not certain about follow-up there.  But her nephrologist did see her yesterday and would be willing to follow-up the blood pressure and make recommendations if the trend is that it remains high.  Today's labs without significant abnormality other than creatinine of 1.27 with a GFR of  45.  But nothing super acute.  Patient has no evidence of any significant endorgan damage no evidence of stroke.  No chest pain no shortness of breath.  No headache currently.  Patient stable for discharge home.  Keep a blood pressure log and follow-up with her nephrologist Final Clinical Impression(s) / ED Diagnoses Final diagnoses:  Primary hypertension    Rx / DC Orders ED Discharge Orders    None       Vanetta Mulders, MD 12/30/20 1934

## 2020-12-30 NOTE — ED Provider Notes (Signed)
Emergency Medicine Provider Triage Evaluation Note  Felicia Rasmussen, a 70 y.o. female evaluated in triage.  Pt complains of HTN, compliant with medications. Saw nephrologist yesterday, was high yesterday but better before leaving clinic. This morning 157/103. [facility] sent here for 187/103, after UC showed abnl. No CP, SOB, leg swelling. Slight headache  BP (!) 193/95 (BP Location: Left Arm)   Pulse (!) 51   Temp 97.8 F (36.6 C) (Oral)   Resp 20   Ht 5\' 7"  (1.702 m)   Wt 90.3 kg   SpO2 100%   BMI 31.17 kg/m   Patient is alert, no acute distress, normal work of breathing, steady gait    Medically screening exam initiated at 5:24 PM. Appropriate orders placed.  Tomasa Linehan was informed that the remainder of the evaluation will be completed by another provider, this initial triage assessment does not replace that evaluation, and the importance of remaining in the ED until their evaluation is complete.       Rebel Laughridge, N, PA-C 12/30/20 1726    01/01/21, MD 12/30/20 1930

## 2020-12-30 NOTE — ED Triage Notes (Addendum)
Pt c/o elevated BP yesterday at nephrologist and again today-states she was seen at [facility] and sent to ED-pt denies pain except for a "slight headache but I don't have that anymore"-NAD-steady gait-pt later added she had pain to both side of neck earlier

## 2020-12-30 NOTE — Discharge Instructions (Addendum)
Start a daily blood pressure log.  I have printed your lab results from here today.  Contact your nephrologist for follow-up on the blood pressure.  If the current trend continues you will need adjustments in your blood pressure medicine.  Continue to take your current medications.  Return for severe headache chest pain shortness of breath or any strokelike symptoms.

## 2020-12-30 NOTE — ED Notes (Signed)
Pt resting comfortably c/o right neck pain and high blood pressure that started this morning.  BP 170/97 now, denies any HA or blurry vision

## 2020-12-30 NOTE — ED Notes (Signed)
Patient transported to X-ray 

## 2021-10-10 IMAGING — CR DG CHEST 2V
2 series · 2 of 2 positions shown · non-contrast
Comparison: None available.

CLINICAL DATA: Hypertension with neck pain.

EXAM:
CHEST - 2 VIEW

[w chest pa]
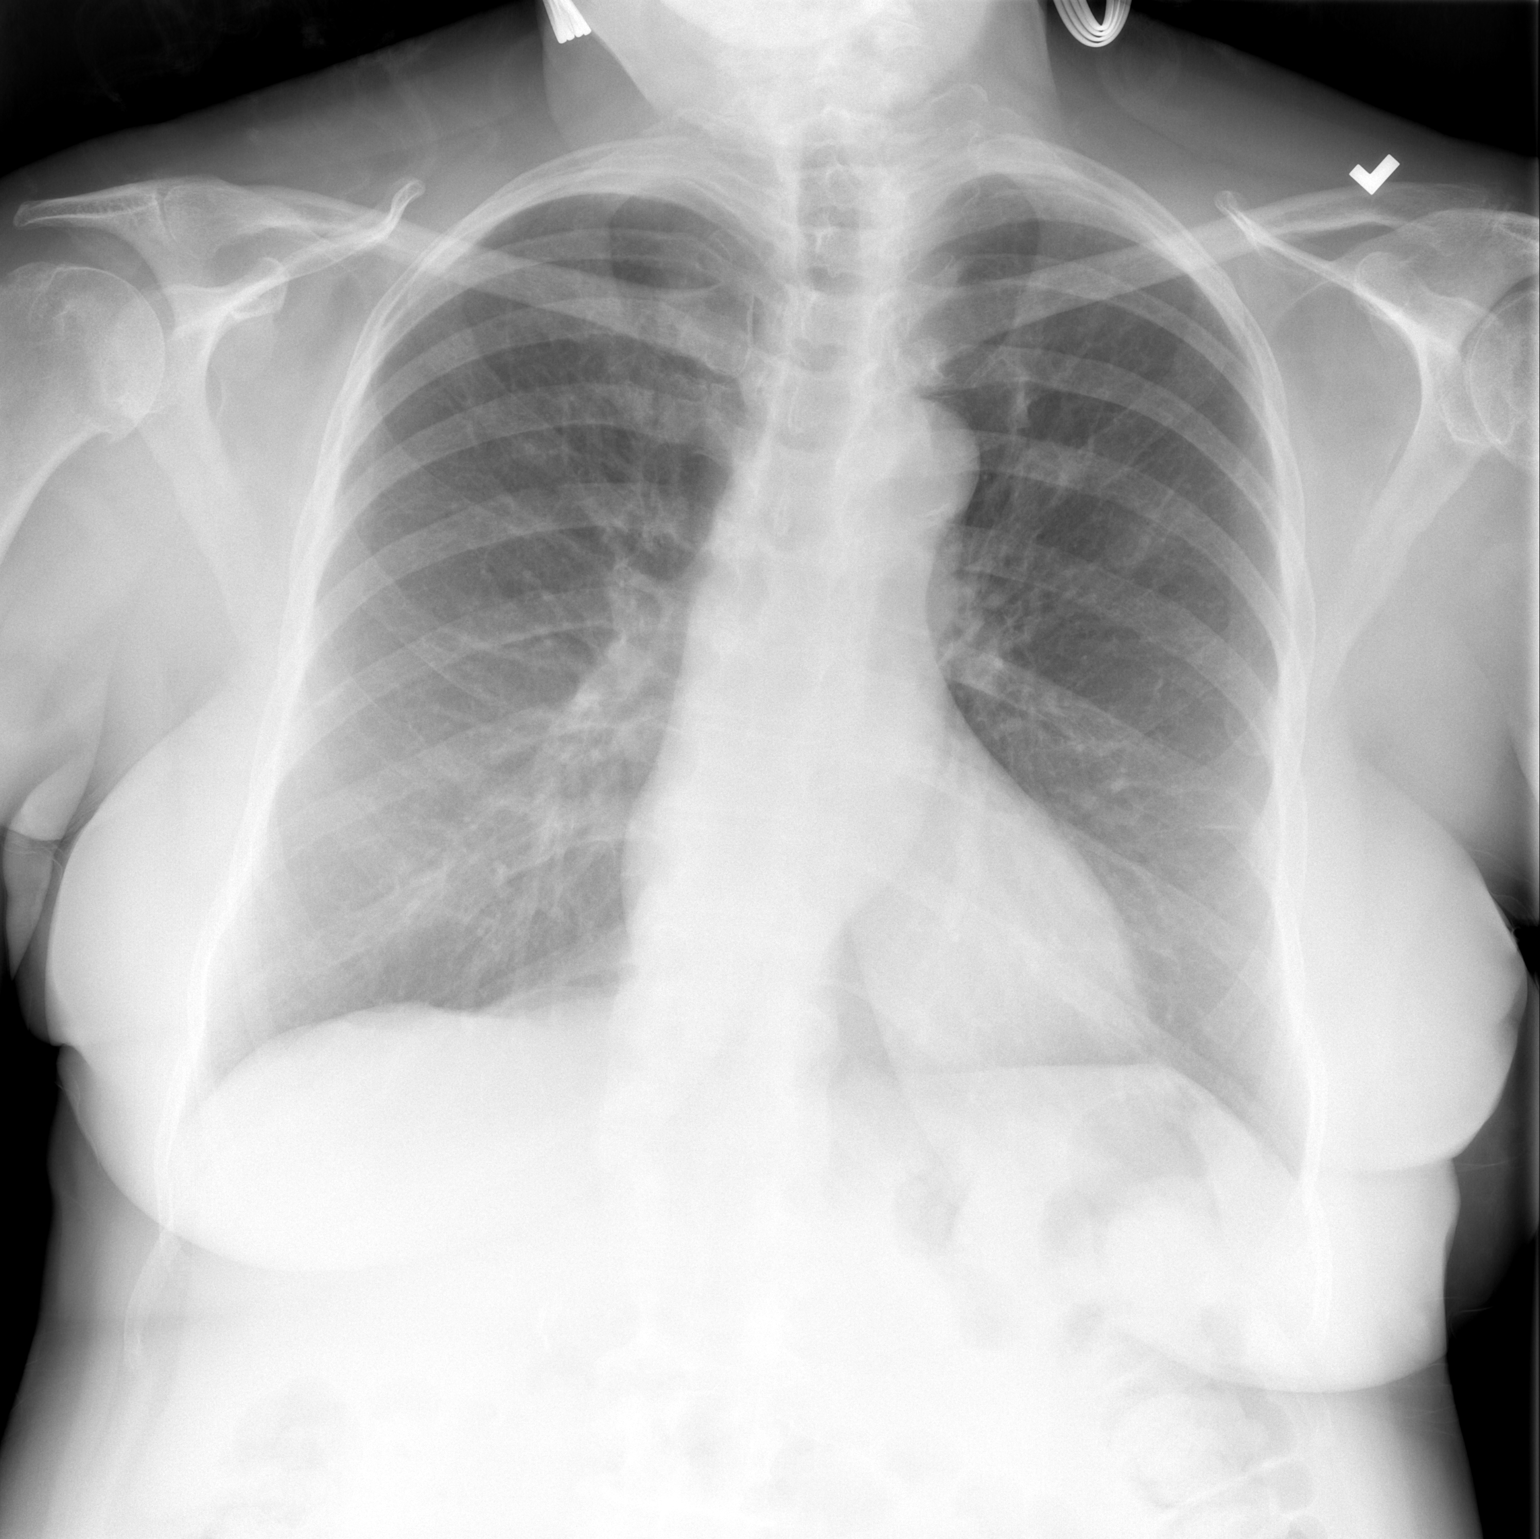

[w chest lat]
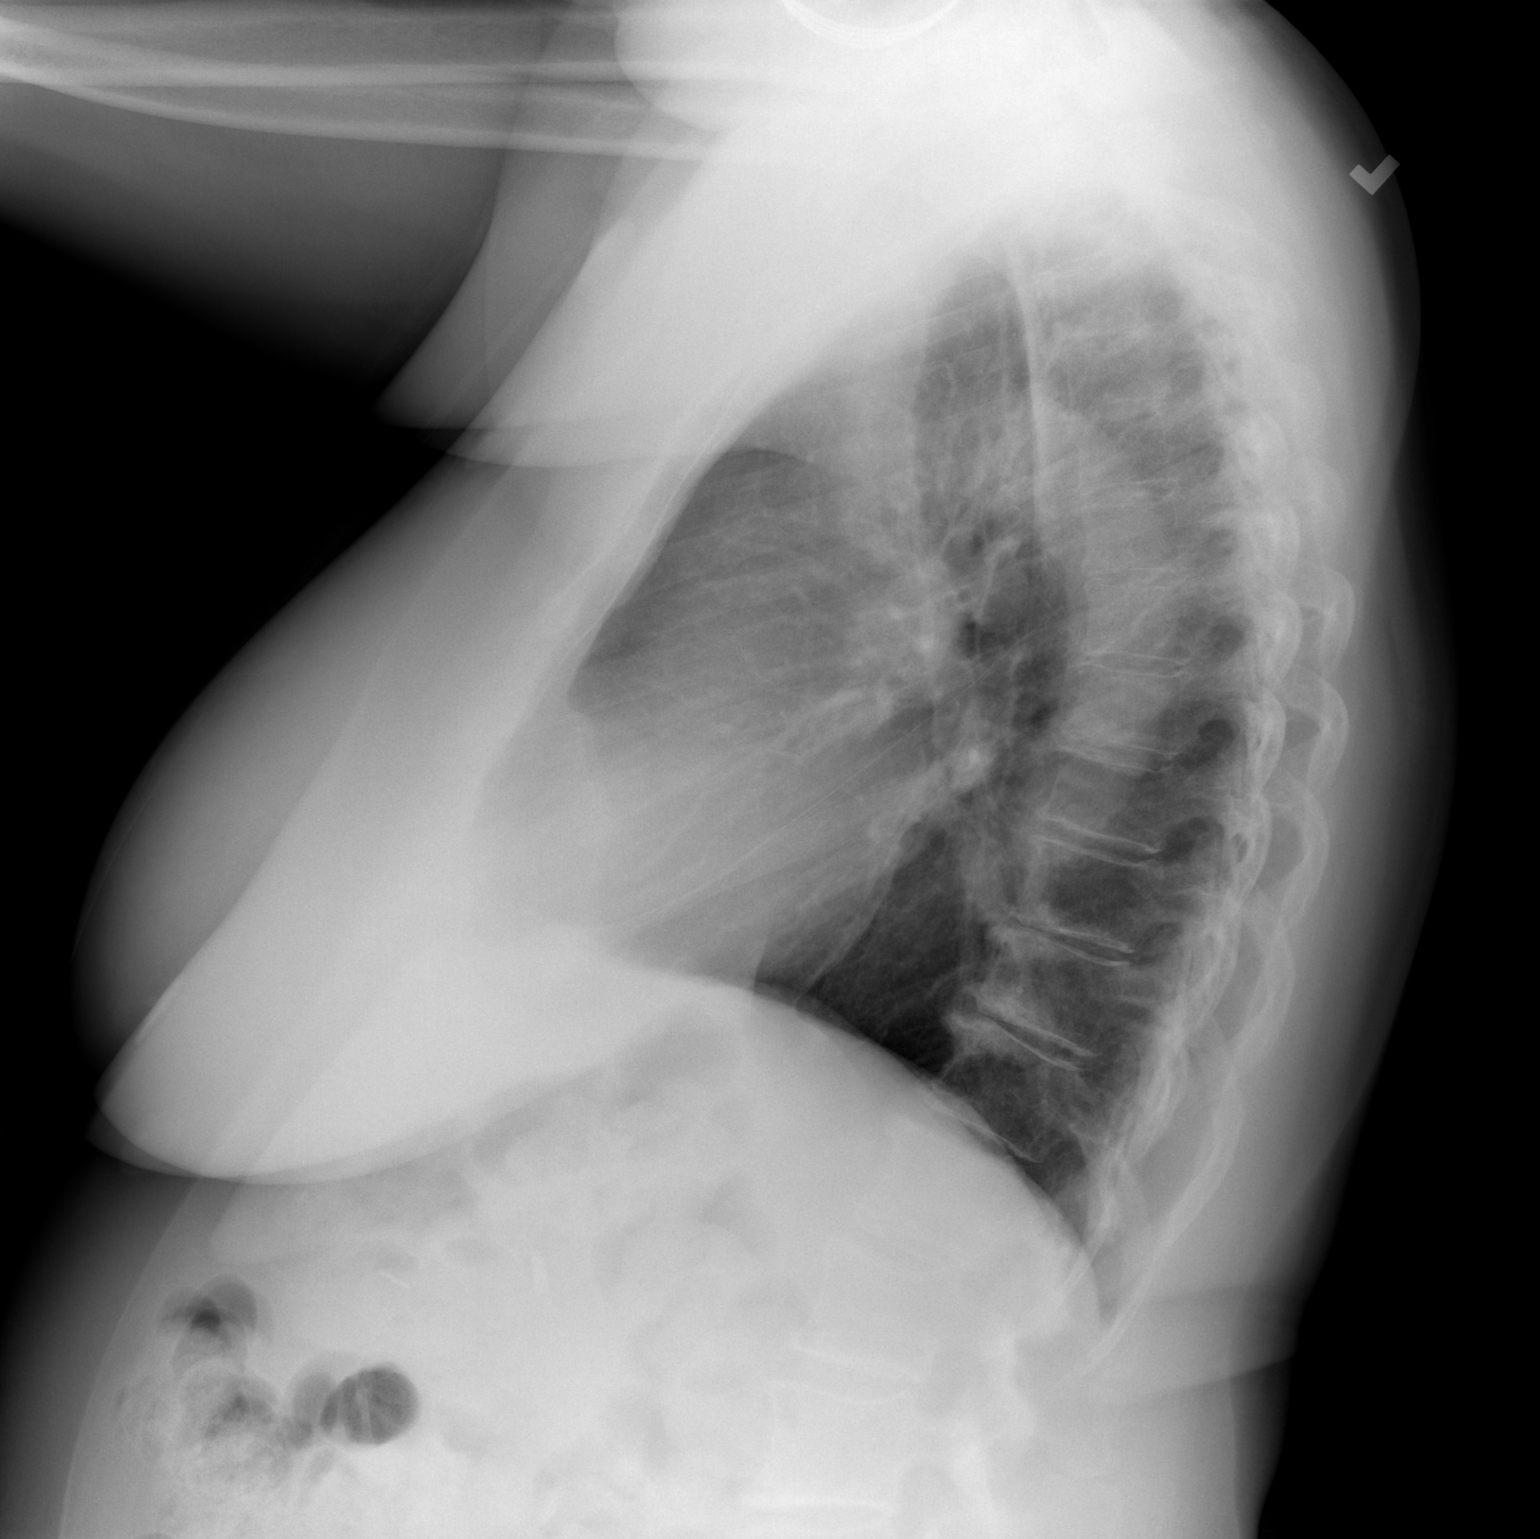

[2 of 2 positions shown; findings below may reference images not displayed]

FINDINGS: Normal heart size.The cardiomediastinal contours are normal. Mild
aortic atherosclerosis. Pulmonary vasculature is normal. No
consolidation, pleural effusion, or pneumothorax. No acute osseous
abnormalities are seen. Thoracic spondylosis most prominently
affecting the lower thoracic spine.
IMPRESSION: No acute chest findings.

## 2024-01-16 ENCOUNTER — Other Ambulatory Visit (HOSPITAL_BASED_OUTPATIENT_CLINIC_OR_DEPARTMENT_OTHER): Payer: Self-pay

## 2024-01-16 MED ORDER — EZETIMIBE 10 MG PO TABS
10.0000 mg | ORAL_TABLET | Freq: Every day | ORAL | 1 refills | Status: AC
  Filled 2024-01-16: qty 90, 90d supply, fill #0

## 2024-01-16 MED ORDER — CARVEDILOL 6.25 MG PO TABS
6.2500 mg | ORAL_TABLET | Freq: Two times a day (BID) | ORAL | 1 refills | Status: AC
  Filled 2024-01-16: qty 180, 90d supply, fill #0

## 2024-01-16 MED ORDER — LOSARTAN POTASSIUM 25 MG PO TABS
25.0000 mg | ORAL_TABLET | Freq: Every day | ORAL | 1 refills | Status: AC
  Filled 2024-01-16: qty 90, 90d supply, fill #0
